# Patient Record
Sex: Female | Born: 1945 | Race: White | Hispanic: No | Smoking: Never smoker
Health system: Southern US, Community
[De-identification: ages and names within clinical notes are randomized; demographics above are authoritative.]

## PROBLEM LIST (undated history)

## (undated) DIAGNOSIS — I10 Essential (primary) hypertension: Secondary | ICD-10-CM

## (undated) HISTORY — PX: OTHER SURGICAL HISTORY: SHX169

## (undated) HISTORY — PX: ABDOMINAL HYSTERECTOMY: SHX81

## (undated) HISTORY — PX: FOOT SURGERY: SHX648

## (undated) HISTORY — PX: TONSILLECTOMY: SUR1361

## (undated) HISTORY — PX: APPENDECTOMY: SHX54

---

## 2014-10-04 ENCOUNTER — Other Ambulatory Visit: Payer: Self-pay | Admitting: Specialist

## 2014-10-04 DIAGNOSIS — M545 Low back pain: Secondary | ICD-10-CM

## 2014-10-12 ENCOUNTER — Ambulatory Visit
Admission: RE | Admit: 2014-10-12 | Discharge: 2014-10-12 | Disposition: A | Discharge status: Home / Self Care | Payer: Medicare Other | Source: Ambulatory Visit | Attending: Specialist | Admitting: Specialist

## 2014-10-12 DIAGNOSIS — M545 Low back pain: Secondary | ICD-10-CM

## 2014-11-16 ENCOUNTER — Encounter (HOSPITAL_BASED_OUTPATIENT_CLINIC_OR_DEPARTMENT_OTHER): Payer: Self-pay | Admitting: *Deleted

## 2014-11-16 ENCOUNTER — Emergency Department (HOSPITAL_BASED_OUTPATIENT_CLINIC_OR_DEPARTMENT_OTHER)
Admission: EM | Admit: 2014-11-16 | Discharge: 2014-11-16 | Disposition: A | Discharge status: Home / Self Care | Payer: Medicare Other | Attending: Emergency Medicine | Admitting: Emergency Medicine

## 2014-11-16 DIAGNOSIS — Z79818 Long term (current) use of other agents affecting estrogen receptors and estrogen levels: Secondary | ICD-10-CM | POA: Insufficient documentation

## 2014-11-16 DIAGNOSIS — R51 Headache: Secondary | ICD-10-CM | POA: Insufficient documentation

## 2014-11-16 DIAGNOSIS — Z792 Long term (current) use of antibiotics: Secondary | ICD-10-CM | POA: Insufficient documentation

## 2014-11-16 DIAGNOSIS — Z79899 Other long term (current) drug therapy: Secondary | ICD-10-CM | POA: Diagnosis not present

## 2014-11-16 DIAGNOSIS — I10 Essential (primary) hypertension: Secondary | ICD-10-CM | POA: Insufficient documentation

## 2014-11-16 DIAGNOSIS — R0981 Nasal congestion: Secondary | ICD-10-CM | POA: Insufficient documentation

## 2014-11-16 DIAGNOSIS — R6884 Jaw pain: Secondary | ICD-10-CM | POA: Diagnosis not present

## 2014-11-16 DIAGNOSIS — Z8709 Personal history of other diseases of the respiratory system: Secondary | ICD-10-CM | POA: Diagnosis not present

## 2014-11-16 HISTORY — DX: Essential (primary) hypertension: I10

## 2014-11-16 MED ORDER — TRIAMCINOLONE ACETONIDE 55 MCG/ACT NA AERO: 2.0000 | INHALATION_SPRAY | Freq: Every day | NASAL | Status: AC

## 2014-11-16 MED ORDER — PSEUDOEPHEDRINE HCL ER 120 MG PO TB12: 120.0000 mg | ORAL_TABLET | Freq: Two times a day (BID) | ORAL | Status: AC

## 2014-11-16 NOTE — ED Provider Notes (Signed)
CSN: [id number]     Arrival date & time 11/16/14  1112 History   First MD Initiated Contact with Patient 11/16/14 1152     Chief Complaint  Patient presents with  . Nasal Congestion    "I think I have a sinus infection"     (Consider location/radiation/quality/duration/timing/severity/associated sxs/prior Treatment) HPI Patient presents with concern of sinus congestion, facial pain. Symptoms began about 2 weeks ago, initially with sinus drainage, rhinorrhea. Over the past days the rhinorrhea has stopped, and the pressure sensation in her face has increased. No fever, nausea, vomiting, headache, chest pain, dyspnea, cough. No relief with OTC medication. Patient is generally well, acknowledges a history only of hypertension or She denies history of heart disease. She has multiple sick great-grandchildren in her care.   Patient was seen at another emergency department several days ago, for her rhinorrhea, sinus pressure. At that time the patient also had anterior jaw pain.  Pain improves with aspirin, and is not currently present. During these episodes she has no chest pain, lightheadedness, other complaints.   Past Medical History  Diagnosis Date  . Hypertension    Past Surgical History  Procedure Laterality Date  . Tonsillectomy    . Lymphnode removal    . Abdominal hysterectomy      partial  . Foot surgery    . Appendectomy     No family history on file. History  Substance Use Topics  . Smoking status: Never Smoker   . Smokeless tobacco: Not on file  . Alcohol Use: No   OB History    No data available     Review of Systems  Constitutional:       Per HPI, otherwise negative  HENT:       Per HPI, otherwise negative  Respiratory:       Per HPI, otherwise negative  Cardiovascular:       Per HPI, otherwise negative  Gastrointestinal: Negative for vomiting.  Endocrine:       Negative aside from HPI  Genitourinary:       Neg aside from HPI   Musculoskeletal:        Per HPI, otherwise negative  Skin: Negative.   Neurological: Negative for syncope.      Allergies  Ceftin and Sulfa antibiotics  Home Medications   Prior to Admission medications   Medication Sig Start Date End Date Taking? Authorizing Provider  amoxicillin (AMOXIL) 500 MG capsule Take 500 mg by mouth 3 (three) times daily.   Yes Historical Provider, MD  calcitonin, salmon, (MIACALCIN/FORTICAL) 200 UNIT/ACT nasal spray Place 1 spray into alternate nostrils daily.   Yes Historical Provider, MD  estradiol (ESTRACE) 1 MG tablet Take 1 mg by mouth daily.   Yes Historical Provider, MD  omeprazole (PRILOSEC OTC) 20 MG tablet Take 20 mg by mouth daily.   Yes Historical Provider, MD  propranolol (INDERAL) 10 MG tablet Take 20 mg by mouth 2 (two) times daily.   Yes Historical Provider, MD  pseudoephedrine (SUDAFED 12 HOUR) 120 MG 12 hr tablet Take 1 tablet (120 mg total) by mouth 2 (two) times daily. 11/16/14   Gerhard Munchobert Rukaya Kleinschmidt, MD  triamcinolone (NASACORT AQ) 55 MCG/ACT AERO nasal inhaler Place 2 sprays into the nose daily. 11/16/14 11/20/14  Gerhard Munchobert Nemesis Rainwater, MD   BP 150/68 mmHg  Pulse 64  Temp(Src) 97.8 F (36.6 C) (Oral)  Resp 16  Ht 4\' 11"  (1.499 m)  Wt 165 lb (74.844 kg)  BMI 33.31 kg/m2  SpO2 95%  Physical Exam  Constitutional: She is oriented to person, place, and time. She appears well-developed and well-nourished. No distress.  HENT:  Head: Normocephalic and atraumatic.  Right Ear: Tympanic membrane normal.  Left Ear: Tympanic membrane normal.  Nose: Nose normal.  Mouth/Throat: Oropharynx is clear and moist and mucous membranes are normal.  Mild TMJ pain bilaterally, with palpation, no malocclusion.  Eyes: Conjunctivae and EOM are normal.  Cardiovascular: Normal rate and regular rhythm.   Pulmonary/Chest: Effort normal and breath sounds normal. No stridor. No respiratory distress.  Abdominal: She exhibits no distension.  Musculoskeletal: She exhibits no edema.   Lymphadenopathy:  No appreciable lymphadenopathy  Neurological: She is alert and oriented to person, place, and time. No cranial nerve deficit.  Skin: Skin is warm and dry.  Psychiatric: She has a normal mood and affect.  Nursing note and vitals reviewed.   ED Course  Procedures (including critical care time)   MDM   Final diagnoses:  Sinus congestion   Patient presents with ongoing sinus congestion, occasional jaw pain. Here the patient is awake, alert, hemodynamically stable, in no distress. No evidence for impending respiratory couple months, no bacteremia or sepsis. Patient did have jaw pain, though her resolution with aspirin, and the absence of any concurrent chest pain, exertional symptoms, dyspnea or lightheadedness reassuring for the low suspicion of ongoing coronary disease or Patient's description of increasing pressure, with decreased drainage suggests sinus irritation. Patient was started on a very short course of decongestant, nasal steroid, will follow up with primary care.      Gerhard Munch, MD 11/16/14 1209

## 2014-11-16 NOTE — Discharge Instructions (Signed)
As discussed, your evaluation today has been largely reassuring.  But, it is important that you monitor your condition carefully, and do not hesitate to return to the ED if you develop new, or concerning changes in your condition. ? ?Otherwise, please follow-up with your physician for appropriate ongoing care. ? ?

## 2014-11-16 NOTE — ED Notes (Signed)
Pt is here with congestion and sinus pain (facial pain).  Pt cares for her great grandkids, they have both been sick with URI symptoms. Pt began having congestion about 2 weeks ago and yesterday she began feeling worse with pain in her face and she reports that she checked her temp and it was 99.42F.

## 2016-02-20 IMAGING — MR MR LUMBAR SPINE W/O CM
4 of 5 series · 16 of 48 positions shown · non-contrast
Comparison: Lumbar radiographs 06/10/2014 and earlier.

CLINICAL DATA: 68-year-old female with low back pain radiating to
both hips for 2 years, increasing. No known injury. Initial
encounter.

EXAM:
MRI LUMBAR SPINE WITHOUT CONTRAST
TECHNIQUE: Multiplanar, multisequence MR imaging of the lumbar spine was
performed. No intravenous contrast was administered.

[Series 2: T1 · sagittal · 4.0mm · 0.84mm/px · 3 of 14 slices shown (1 of 2)]
[im 3/14]
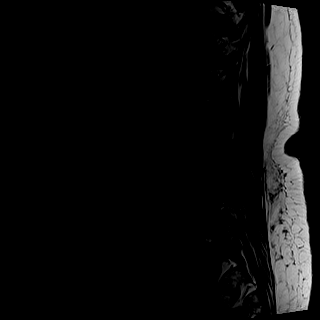
[im 7/14]
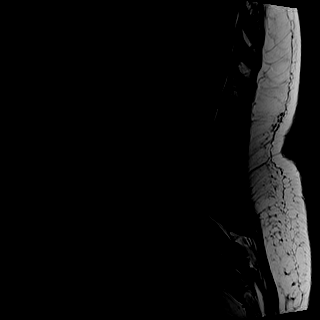
[im 11/14]
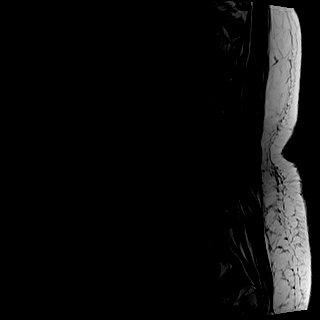

[Series 3: T2 · sagittal · 4.0mm · 0.42mm/px · 7 of 14 slices shown (1 of 2)]
[im 1/14]
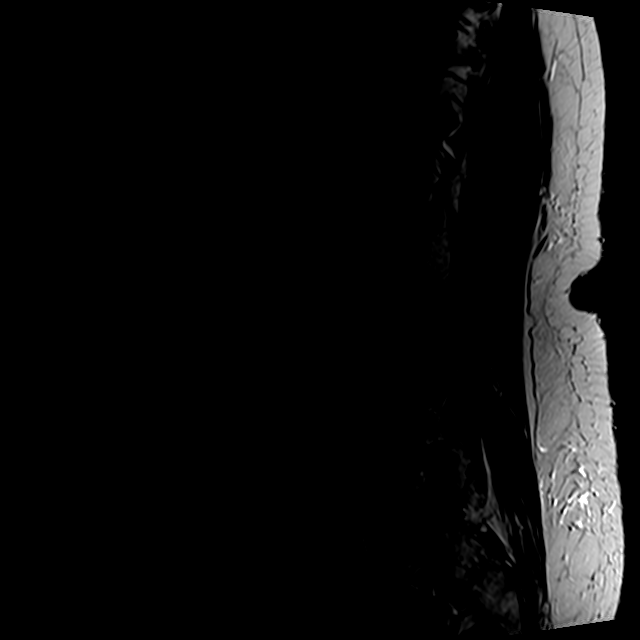
[im 3/14]
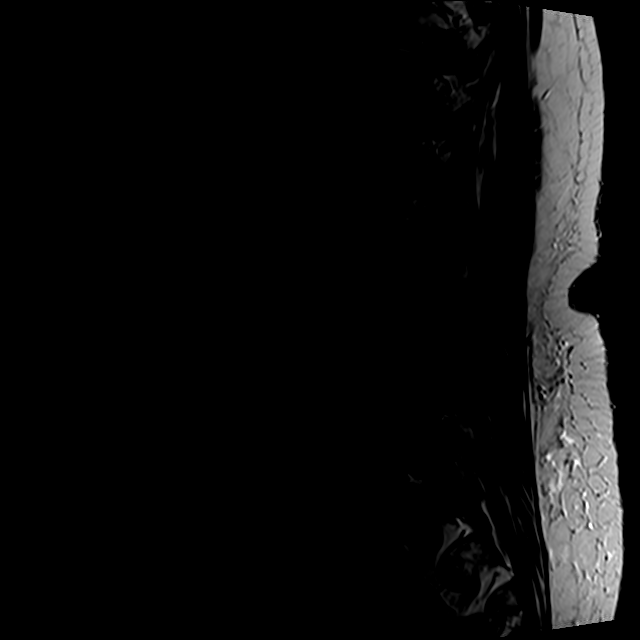
[im 5/14]
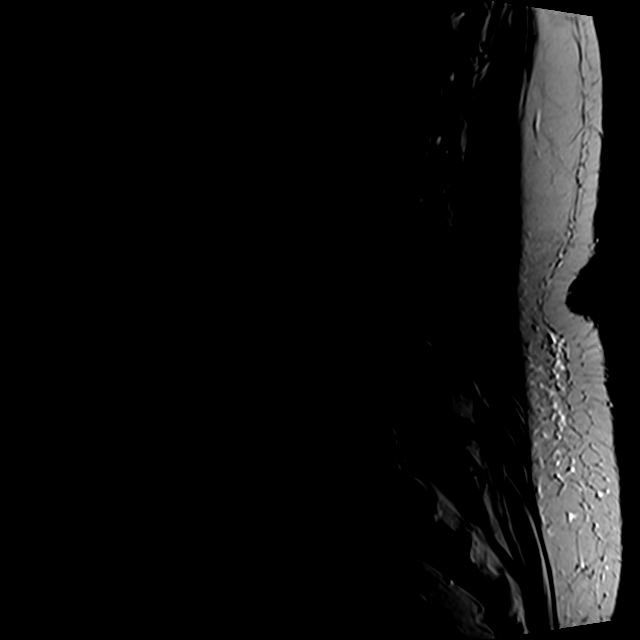
[im 7/14]
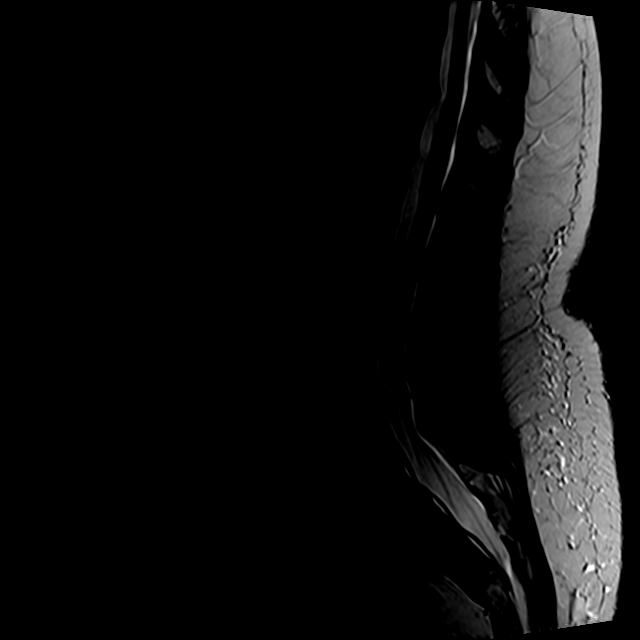
[im 9/14]
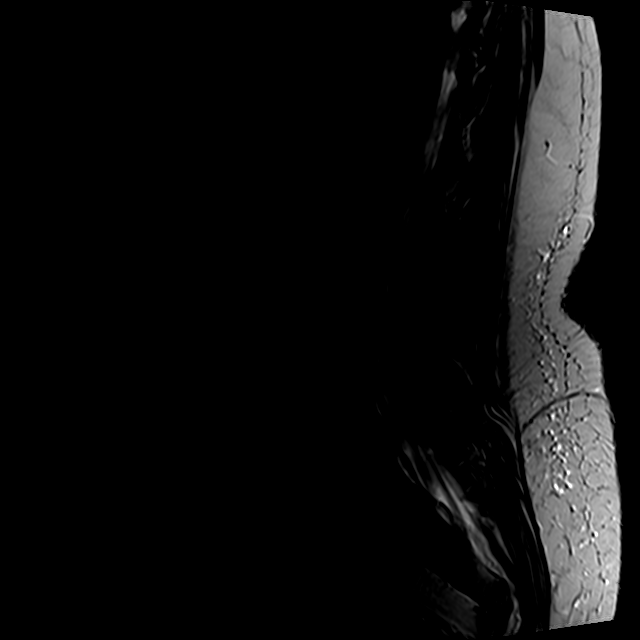
[im 11/14]
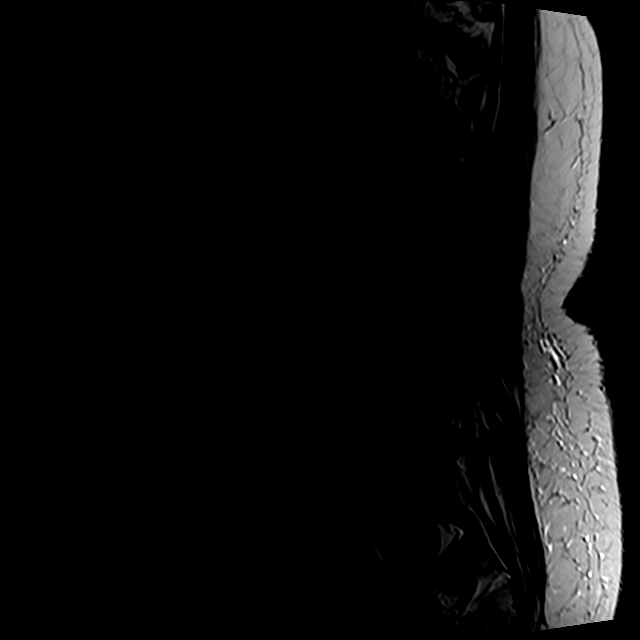
[im 14/14]
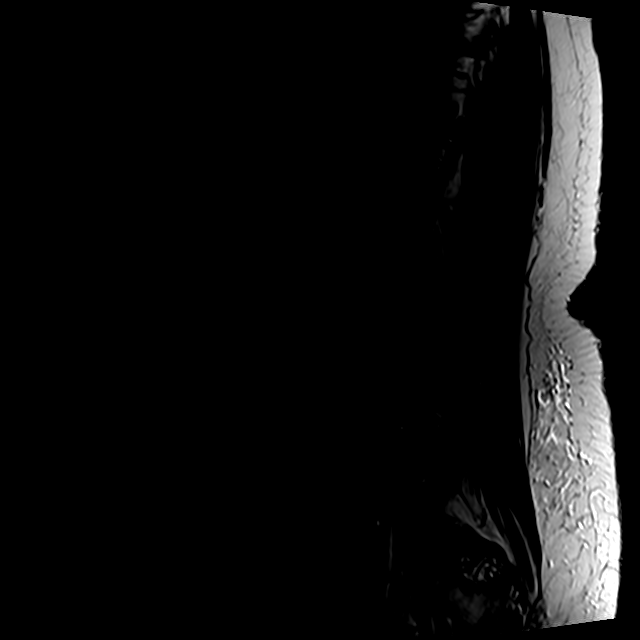

[Series 4: T2 · axial · 4.0mm · 0.37mm/px · z∈[-88,+31]mm · 3 of 31 slices shown (2 of 2)]
[im 5/31]
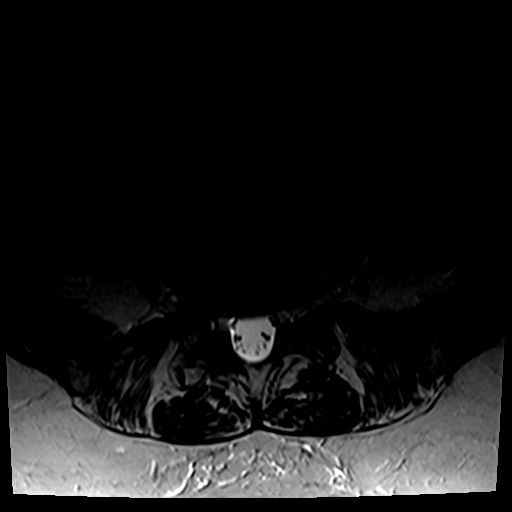
[im 17/31]
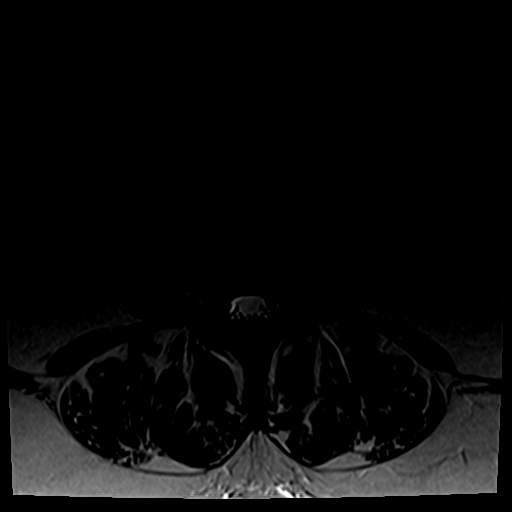
[im 26/31]
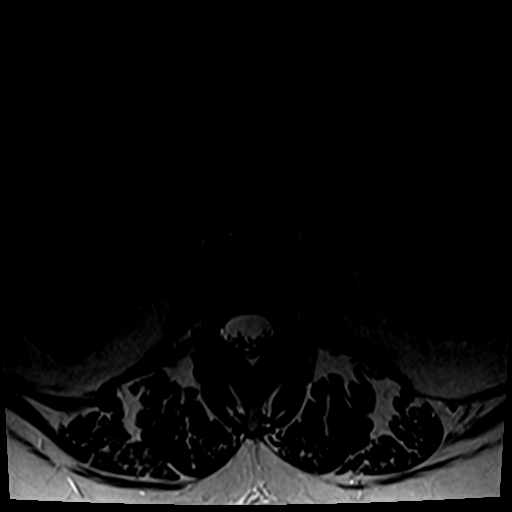

[Series 6: T1 · axial · 4.0mm · 0.37mm/px · z∈[-88,+31]mm · 3 of 31 slices shown (2 of 2)]
[im 5/31]
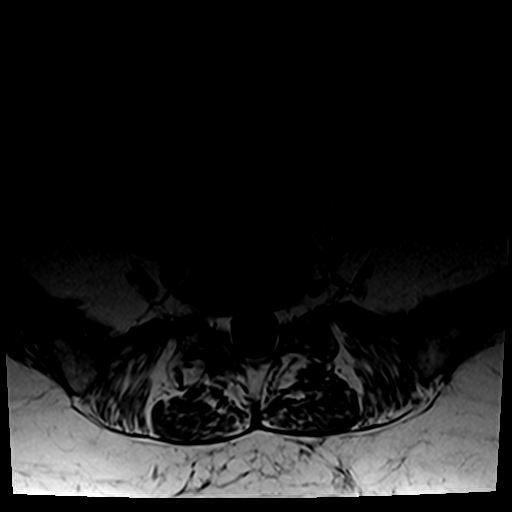
[im 17/31]
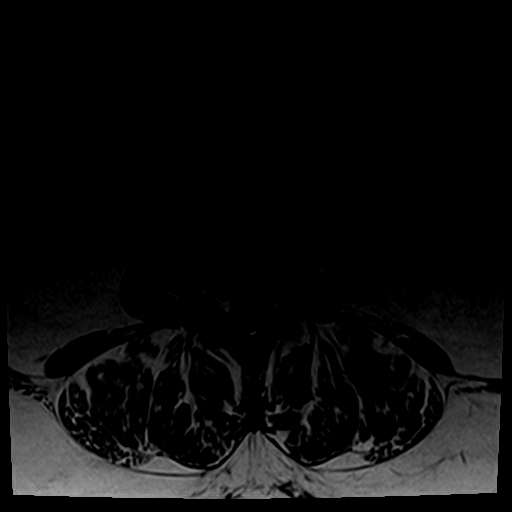
[im 26/31]
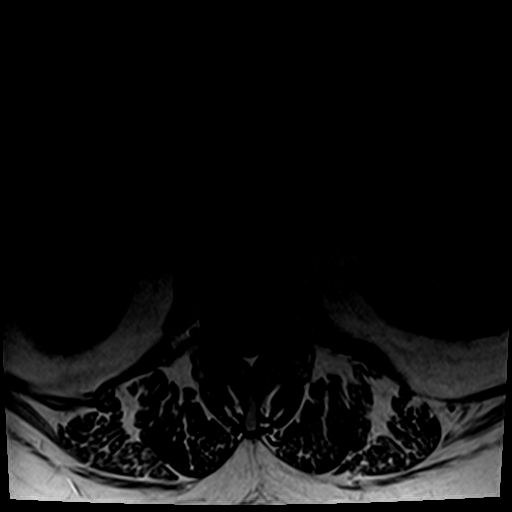

[16 of 48 positions shown; findings below may reference images not displayed]

FINDINGS: Normal lumbar segmentation depicted on radiographs. Stable vertebral
height and alignment including grade 1 anterolisthesis at L5-S1.
Associated posterior element degeneration, and there is mild
posterior element marrow edema at L5 greater on the right (series 5,
image 3). See additional details of that level below. There is also
trace anterolisthesis at L4-L5 with posterior element degeneration.
No acute osseous abnormality identified.

Visualized lower thoracic spinal cord is normal with conus medularis
at L1.

Visualized abdominal viscera and paraspinal soft tissues are within
normal limits.

T10-T11:  Negative.

T11-T12: Disc desiccation and disc space loss. Disc bulge with
broad-based central component of disc. Narrowing of the ventral CSF
space resulting in borderline to mild spinal stenosis. No foraminal
involvement.

T12-L1:  Mild disc bulge.

L1-L2:  Negative.

L2-L3:  Negative.

L3-L4:  Mild disc bulge.  Mild facet hypertrophy.  No stenosis.

L4-L5: Trace anterolisthesis. Disc desiccation. Mild circumferential
disc bulge. Moderate to severe facet and ligament flavum
hypertrophy. Small posteriorly situated synovial cysts (series 4,
image 22) should not cause neural compromise. No spinal or lateral
recess stenosis. No foraminal stenosis.

L5-S1: Grade 1 anterolisthesis. Disc desiccation and circumferential
disc bulge/pseudo disc. Central posterior annular fissure best seen
on series 5, image 7. Moderate to severe bilateral facet
hypertrophy. L5 posterior pedicle and lamina level edema greater on
the right. No spinal or lateral recess stenosis. Questionable tiny
synovial cyst on the undersurface of the right facet as seen on
series 3, image 4, but no direct neural impingement identified.
There is overall mild multifactorial right L5 foraminal stenosis.

Visible sacrum intact.
IMPRESSION: 1. Grade 1 spondylolisthesis at L4-L5 and L5-S1. Moderate to severe
facet arthropathy at both levels, suspected to be the symptomatic
abnormality in this setting. Furthermore there is mild marrow edema
affecting the L5 posterior elements greater on the right, favor
acute exacerbation of chronic facet joint arthritis.
2. No lumbar spinal stenosis or severe neural impingement. There is
mild right L5 foraminal stenosis.

## 2021-05-18 ENCOUNTER — Other Ambulatory Visit: Payer: Self-pay

## 2021-05-18 ENCOUNTER — Ambulatory Visit (INDEPENDENT_AMBULATORY_CARE_PROVIDER_SITE_OTHER): Payer: Medicare Other

## 2021-05-18 ENCOUNTER — Ambulatory Visit (INDEPENDENT_AMBULATORY_CARE_PROVIDER_SITE_OTHER): Payer: Medicare Other | Admitting: Sports Medicine

## 2021-05-18 DIAGNOSIS — M1811 Unilateral primary osteoarthritis of first carpometacarpal joint, right hand: Secondary | ICD-10-CM

## 2021-05-18 NOTE — Progress Notes (Signed)
    Procedures performed today:    Procedure: Real-time Ultrasound Guided injection of the right first carpometacarpal joint Device: Samsung HS60  Verbal informed consent obtained.  Time-out conducted.  Noted no overlying erythema, induration, or other signs of local infection.  Skin prepped in a sterile fashion.  Local anesthesia: Topical Ethyl chloride.  With sterile technique and under real time ultrasound guidance: Noted arthritic joint, 1/2 cc lidocaine, 1/2 cc kenalog 40 injected easily. Completed without difficulty  Advised to call if fevers/chills, erythema, induration, drainage, or persistent bleeding.  Images permanently stored and available for review in PACS.  Impression: Technically successful ultrasound guided injection.  Independent interpretation of notes and tests performed by another provider:   None.  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 On exam she has the typical squared off appearance of CMC osteoarthritis, she is tried etodolac, bracing, activity modification without much improvement. Today we did an injection of the [facility] joint, adding x-rays, home conditioning exercises, return to see me in a month.  This is a chronic process with exacerbation and pharmacologic intervention   ___________________________________________ Ihor Austin. Benjamin Stain, M.D., ABFM., CAQSM. Primary Care and Sports Medicine [place] [facility]  Adjunct Instructor of Family Medicine  [employer]

## 2021-05-18 NOTE — Assessment & Plan Note (Signed)
This is a very pleasant 75 year old female, she has a long history of pain in her right hand localized at the thumb basal joint, she has noted a knot here. On exam she has the typical squared off appearance of CMC osteoarthritis, she is tried etodolac, bracing, activity modification without much improvement. Today we did an injection of the [facility] joint, adding x-rays, home conditioning exercises, return to see me in a month.

## 2021-06-22 ENCOUNTER — Ambulatory Visit: Payer: Medicare Other | Admitting: Sports Medicine

## 2021-08-26 ENCOUNTER — Ambulatory Visit (INDEPENDENT_AMBULATORY_CARE_PROVIDER_SITE_OTHER): Payer: Medicare Other

## 2021-08-26 ENCOUNTER — Other Ambulatory Visit: Payer: Self-pay

## 2021-08-26 ENCOUNTER — Ambulatory Visit (INDEPENDENT_AMBULATORY_CARE_PROVIDER_SITE_OTHER): Payer: Medicare Other | Admitting: Sports Medicine

## 2021-08-26 DIAGNOSIS — M542 Cervicalgia: Secondary | ICD-10-CM | POA: Diagnosis not present

## 2021-08-26 DIAGNOSIS — G8929 Other chronic pain: Secondary | ICD-10-CM | POA: Diagnosis not present

## 2021-08-26 DIAGNOSIS — M79645 Pain in left finger(s): Secondary | ICD-10-CM

## 2021-08-26 DIAGNOSIS — M25512 Pain in left shoulder: Secondary | ICD-10-CM | POA: Diagnosis not present

## 2021-08-26 MED ORDER — PREDNISONE 50 MG PO TABS: ORAL_TABLET | ORAL | 0 refills | Status: DC

## 2021-08-26 NOTE — Assessment & Plan Note (Signed)
This is a pleasant 75 year old female, chronic left shoulder pain for over a month now, pain is localized over the deltoid with radiation to the elbow, weak to abduction, positive Neer sign, positive speeds test. I do suspect rotator cuff disease, and likely some glenohumeral osteoarthritis, adding x-rays of the shoulder and neck as she does have some trapezial pain, 5 days of prednisone, she will hold off on her NSAIDs for these 5 days, aggressive formal physical therapy. Return to see me in 6 weeks, MR +/- injection if not better.

## 2021-08-26 NOTE — Progress Notes (Signed)
    Procedures performed today:    None.  Independent interpretation of notes and tests performed by another provider:   None.  Brief History, Exam, Impression, and Recommendations:    Chronic left shoulder pain This is a pleasant 75 year old female, chronic left shoulder pain for over a month now, pain is localized over the deltoid with radiation to the elbow, weak to abduction, positive Neer sign, positive speeds test. I do suspect rotator cuff disease, and likely some glenohumeral osteoarthritis, adding x-rays of the shoulder and neck as she does have some trapezial pain, 5 days of prednisone, she will hold off on her NSAIDs for these 5 days, aggressive formal physical therapy. Return to see me in 6 weeks, MR +/- injection if not better.    ___________________________________________ Ihor Austin. Benjamin Stain, M.D., ABFM., CAQSM. Primary Care and Sports Medicine [place] [facility]  Adjunct Instructor of Family Medicine  [employer]

## 2021-10-07 ENCOUNTER — Ambulatory Visit (INDEPENDENT_AMBULATORY_CARE_PROVIDER_SITE_OTHER): Payer: Medicare Other | Admitting: Sports Medicine

## 2021-10-07 ENCOUNTER — Ambulatory Visit (INDEPENDENT_AMBULATORY_CARE_PROVIDER_SITE_OTHER): Payer: Medicare Other

## 2021-10-07 ENCOUNTER — Other Ambulatory Visit: Payer: Self-pay

## 2021-10-07 DIAGNOSIS — G8929 Other chronic pain: Secondary | ICD-10-CM

## 2021-10-07 DIAGNOSIS — M4316 Spondylolisthesis, lumbar region: Secondary | ICD-10-CM

## 2021-10-07 DIAGNOSIS — M25512 Pain in left shoulder: Secondary | ICD-10-CM | POA: Diagnosis not present

## 2021-10-07 NOTE — Assessment & Plan Note (Signed)
Pleasant 76 year old female, chronic low back pain, she did see Dr. Louanne Skye in the past, had an MRI that showed L5-S1 spondylolisthesis, grade 1 with bilateral L4-S1 severe facet osteoarthritis. Getting some updated x-rays today, I would like some aggressive formal physical therapy, if this fails we will get an updated MRI and proceed with bilateral L4-S1 facet joint injections.

## 2021-10-07 NOTE — Progress Notes (Signed)
° ° °  Procedures performed today:    None.  Independent interpretation of notes and tests performed by another provider:   MRI from 2016 personally reviewed, grade 1 L5-S1 spondylolisthesis, also fairly severe L4-S1 bilateral facet osteoarthritis.  Brief History, Exam, Impression, and Recommendations:    Chronic left shoulder pain Regina Page returns, she is a pleasant 76 year old female, chronic left shoulder pain, we suspected rotator cuff disease and glenohumeral osteoarthritis, she never did physical therapy, reordering this. Return to see me in 6 weeks, MRI +/- injection if not better.  Spondylolisthesis of lumbar region Pleasant 76 year old female, chronic low back pain, she did see Dr. Otelia Sergeant in the past, had an MRI that showed L5-S1 spondylolisthesis, grade 1 with bilateral L4-S1 severe facet osteoarthritis. Getting some updated x-rays today, I would like some aggressive formal physical therapy, if this fails we will get an updated MRI and proceed with bilateral L4-S1 facet joint injections.    ___________________________________________ Ihor Austin. Benjamin Stain, M.D., ABFM., CAQSM. Primary Care and Sports Medicine [place] [facility]  Adjunct Instructor of Family Medicine  [employer]

## 2021-10-07 NOTE — Assessment & Plan Note (Signed)
Regina Page returns, she is a pleasant 76 year old female, chronic left shoulder pain, we suspected rotator cuff disease and glenohumeral osteoarthritis, she never did physical therapy, reordering this. Return to see me in 6 weeks, MRI +/- injection if not better.

## 2021-11-18 ENCOUNTER — Ambulatory Visit (INDEPENDENT_AMBULATORY_CARE_PROVIDER_SITE_OTHER): Payer: Medicare Other | Admitting: Sports Medicine

## 2021-11-18 ENCOUNTER — Other Ambulatory Visit: Payer: Self-pay

## 2021-11-18 DIAGNOSIS — M47812 Spondylosis without myelopathy or radiculopathy, cervical region: Secondary | ICD-10-CM | POA: Insufficient documentation

## 2021-11-18 DIAGNOSIS — M4316 Spondylolisthesis, lumbar region: Secondary | ICD-10-CM | POA: Diagnosis not present

## 2021-11-18 DIAGNOSIS — M1811 Unilateral primary osteoarthritis of first carpometacarpal joint, right hand: Secondary | ICD-10-CM | POA: Diagnosis not present

## 2021-11-18 MED ORDER — IBUPROFEN 400 MG PO TABS: ORAL_TABLET | ORAL | 3 refills | Status: DC

## 2021-11-18 NOTE — Assessment & Plan Note (Signed)
Chronic low back pain, has seen Dr. Otelia Sergeant in the past, has had an MRI that showed L5-S1 spondylolisthesis as well as bilateral L4-S1 facet arthritis, severe. She has done fairly well, ibuprofen 400 mg 1 or 2 times a day seems to control her pain well enough where we can avoid spinal intervention. If this stops working we will proceed with bilateral L4-S1 facet joint injections.

## 2021-11-18 NOTE — Progress Notes (Signed)
° ° °  Procedures performed today:    Procedure:  Injection of bilateral paracervical trigger point Consent obtained and verified. Time-out conducted. Noted no overlying erythema, induration, or other signs of local infection. Skin prepped in a sterile fashion. Topical analgesic spray: Ethyl chloride. Completed without difficulty. Meds: 2 cc lidocaine and 2 cc kenalog 40 spread out between the 2 trigger points. Advised to call if fevers/chills, erythema, induration, drainage, or persistent bleeding.  Independent interpretation of notes and tests performed by another provider:   None.  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  Spondylolisthesis of lumbar region Chronic low back pain, has seen Dr. Otelia Sergeant in the past, has had an MRI that showed L5-S1 spondylolisthesis as well as bilateral L4-S1 facet arthritis, severe. She has done fairly well, ibuprofen 400 mg 1 or 2 times a day seems to control her pain well enough where we can avoid spinal intervention. If this stops working we will proceed with bilateral L4-S1 facet joint injections.  Cervical spondylosis Saige shoulder pain is better, she does have some periscapular and paracervical neck pain. I did trigger point injections into her trapezius bilaterally. Return to see me as needed, if insufficient improvement we will proceed with additional investigation of her cervical spine.    ___________________________________________ Ihor Austin. Benjamin Stain, M.D., ABFM., CAQSM. Primary Care and Sports Medicine [place] [facility]  Adjunct Instructor of Family Medicine  [employer]

## 2021-11-18 NOTE — Assessment & Plan Note (Signed)
Continues to do really well after CMC injection back in August 2022.

## 2021-11-18 NOTE — Assessment & Plan Note (Signed)
Regina Page shoulder pain is better, she does have some periscapular and paracervical neck pain. I did trigger point injections into her trapezius bilaterally. Return to see me as needed, if insufficient improvement we will proceed with additional investigation of her cervical spine.

## 2021-12-16 ENCOUNTER — Ambulatory Visit: Payer: Medicare Other | Admitting: Sports Medicine

## 2022-07-15 ENCOUNTER — Ambulatory Visit (INDEPENDENT_AMBULATORY_CARE_PROVIDER_SITE_OTHER): Payer: Medicare Other | Admitting: Sports Medicine

## 2022-07-15 DIAGNOSIS — M1811 Unilateral primary osteoarthritis of first carpometacarpal joint, right hand: Secondary | ICD-10-CM | POA: Diagnosis not present

## 2022-07-15 DIAGNOSIS — M4316 Spondylolisthesis, lumbar region: Secondary | ICD-10-CM | POA: Diagnosis not present

## 2022-07-15 MED ORDER — CELECOXIB 200 MG PO CAPS: ORAL_CAPSULE | ORAL | 2 refills | Status: AC

## 2022-07-15 NOTE — Progress Notes (Signed)
    Procedures performed today:    None.  Independent interpretation of notes and tests performed by another provider:   None.  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 She was complaining of some intermittent midepigastric pain I think it is prudent to switch to something easier on her stomach, continue PPI.  Spondylolisthesis of lumbar region Center also has chronic right-sided axial low back pain worse with walking, she has seen Dr. Louanne Skye in the past per She has had an MRI that showed L5-S1 spondylolisthesis as well as bilateral L4-S1 severe facet arthritis per Historically did well with ibuprofen, switching to Celebrex due to midepigastric pain. I will restart her home conditioning and if she does not respond in 4 to 6 weeks we will proceed with bilateral L4-S1 facet joint injections followed by a right L5-S1 interlaminar epidural if insufficient improvement with the facet joint injections.  Chronic process with exacerbation and pharmacologic intervention  ____________________________________________ Gwen Her. Dianah Field, M.D., ABFM., CAQSM., AME. Primary Care and Sports Medicine [facility]  Adjunct Professor of [place] [employer] Medicine  Risk manager

## 2022-07-15 NOTE — Assessment & Plan Note (Signed)
This is a very pleasant 76 year old female, multiple location osteoarthritis, we had been treating her for a trapeziometacarpal joint osteoarthritis, I injected the trapeziometacarpal joint in August 2022 and she did well until recently, now having a slight recurrence of pain but only intermittently, we will have her restart the physical therapy, and switching from ibuprofen 400 to Celebrex 200mg  1-2 times daily. She was complaining of some intermittent midepigastric pain I think it is prudent to switch to something easier on her stomach, continue PPI.

## 2022-07-15 NOTE — Assessment & Plan Note (Signed)
Center also has chronic right-sided axial low back pain worse with walking, she has seen Dr. Louanne Skye in the past per She has had an MRI that showed L5-S1 spondylolisthesis as well as bilateral L4-S1 severe facet arthritis per Historically did well with ibuprofen, switching to Celebrex due to midepigastric pain. I will restart her home conditioning and if she does not respond in 4 to 6 weeks we will proceed with bilateral L4-S1 facet joint injections followed by a right L5-S1 interlaminar epidural if insufficient improvement with the facet joint injections.

## 2024-05-31 ENCOUNTER — Encounter: Payer: Self-pay | Admitting: Sports Medicine
